# Patient Record
Sex: Female | Born: 1975 | Race: White | Hispanic: No | Marital: Single
Health system: Southern US, Academic
[De-identification: ages and names within clinical notes are randomized; demographics above are authoritative.]

## PROBLEM LIST (undated history)

## (undated) DIAGNOSIS — F419 Anxiety disorder, unspecified: Secondary | ICD-10-CM

## (undated) DIAGNOSIS — F411 Generalized anxiety disorder: Secondary | ICD-10-CM

## (undated) DIAGNOSIS — M62838 Other muscle spasm: Secondary | ICD-10-CM

## (undated) DIAGNOSIS — M129 Arthropathy, unspecified: Secondary | ICD-10-CM

## (undated) HISTORY — PX: HX GALL BLADDER SURGERY/CHOLE: SHX55

## (undated) HISTORY — DX: Generalized anxiety disorder: F41.1

## (undated) HISTORY — PX: NECK SURGERY: SHX720

## (undated) HISTORY — DX: Arthropathy, unspecified: M12.9

## (undated) HISTORY — PX: HX HYSTERECTOMY: SHX81

## (undated) HISTORY — DX: Other muscle spasm: M62.838

## (undated) HISTORY — PX: ABDOMINAL HYSTERECTOMY: SHX81

## (undated) HISTORY — PX: CHOLECYSTECTOMY: SHX55

---

## 2013-12-26 ENCOUNTER — Emergency Department (HOSPITAL_COMMUNITY)
Admission: EM | Admit: 2013-12-26 | Discharge: 2013-12-26 | Disposition: A | Discharge status: Home / Self Care | Payer: Medicaid Other | Attending: Emergency Medicine | Admitting: Emergency Medicine

## 2013-12-26 ENCOUNTER — Emergency Department (HOSPITAL_COMMUNITY): Payer: Medicaid Other

## 2013-12-26 DIAGNOSIS — X028XXA Other exposure to controlled fire in building or structure, initial encounter: Secondary | ICD-10-CM | POA: Insufficient documentation

## 2013-12-26 DIAGNOSIS — Y9389 Activity, other specified: Secondary | ICD-10-CM | POA: Insufficient documentation

## 2013-12-26 DIAGNOSIS — Y929 Unspecified place or not applicable: Secondary | ICD-10-CM | POA: Insufficient documentation

## 2013-12-26 DIAGNOSIS — T23219A Burn of second degree of unspecified thumb (nail), initial encounter: Secondary | ICD-10-CM

## 2013-12-26 DIAGNOSIS — T2111XA Burn of first degree of chest wall, initial encounter: Secondary | ICD-10-CM | POA: Insufficient documentation

## 2013-12-26 DIAGNOSIS — T24109A Burn of first degree of unspecified site of unspecified lower limb, except ankle and foot, initial encounter: Secondary | ICD-10-CM | POA: Insufficient documentation

## 2013-12-26 DIAGNOSIS — Z3202 Encounter for pregnancy test, result negative: Secondary | ICD-10-CM | POA: Insufficient documentation

## 2013-12-26 DIAGNOSIS — T2010XA Burn of first degree of head, face, and neck, unspecified site, initial encounter: Secondary | ICD-10-CM | POA: Insufficient documentation

## 2013-12-26 DIAGNOSIS — T2210XA Burn of first degree of shoulder and upper limb, except wrist and hand, unspecified site, initial encounter: Secondary | ICD-10-CM | POA: Insufficient documentation

## 2013-12-26 DIAGNOSIS — T300 Burn of unspecified body region, unspecified degree: Secondary | ICD-10-CM

## 2013-12-26 LAB — BASIC METABOLIC PANEL
BUN: 8 mg/dL (ref 6–23)
CALCIUM: 9.1 mg/dL (ref 8.4–10.5)
CO2: 21 mEq/L (ref 19–32)
Chloride: 103 mEq/L (ref 96–112)
Creatinine, Ser: 0.66 mg/dL (ref 0.50–1.10)
GFR calc non Af Amer: >90 mL/min (ref >90)
Glucose, Bld: 114 mg/dL — ABNORMAL HIGH (ref 70–99)
POTASSIUM: 4.2 meq/L (ref 3.7–5.3)
Sodium: 140 mEq/L (ref 137–147)

## 2013-12-26 LAB — PREPARE FRESH FROZEN PLASMA
UNIT DIVISION: 0
Unit division: 0

## 2013-12-26 LAB — URINALYSIS, ROUTINE W REFLEX MICROSCOPIC
BILIRUBIN URINE: NEGATIVE
GLUCOSE, UA: NEGATIVE mg/dL
Hgb urine dipstick: NEGATIVE
Ketones, ur: NEGATIVE mg/dL
Leukocytes, UA: NEGATIVE
Nitrite: NEGATIVE
PROTEIN: NEGATIVE mg/dL
Specific Gravity, Urine: 1.009 (ref 1.005–1.030)
Urobilinogen, UA: 0.2 mg/dL (ref 0.0–1.0)
pH: 7.5 (ref 5.0–8.0)

## 2013-12-26 LAB — PREGNANCY, URINE: Preg Test, Ur: NEGATIVE

## 2013-12-26 LAB — CBC
HEMATOCRIT: 40 % (ref 36.0–46.0)
Hemoglobin: 13.7 g/dL (ref 12.0–15.0)
MCH: 31.1 pg (ref 26.0–34.0)
MCHC: 34.3 g/dL (ref 30.0–36.0)
MCV: 90.7 fL (ref 78.0–100.0)
Platelets: 423 10*3/uL — ABNORMAL HIGH (ref 150–400)
RBC: 4.41 MIL/uL (ref 3.87–5.11)
RDW: 13.9 % (ref 11.5–15.5)
WBC: 14.8 10*3/uL — ABNORMAL HIGH (ref 4.0–10.5)

## 2013-12-26 MED ORDER — OXYCODONE-ACETAMINOPHEN 5-325 MG PO TABS: 1.0000 | ORAL_TABLET | ORAL | Status: DC | PRN

## 2013-12-26 MED ORDER — DOCUSATE SODIUM 100 MG PO CAPS: 100.0000 mg | ORAL_CAPSULE | Freq: Two times a day (BID) | ORAL | Status: DC

## 2013-12-26 MED ORDER — SILVER SULFADIAZINE 1 % EX CREA: 1.0000 "application " | TOPICAL_CREAM | Freq: Two times a day (BID) | CUTANEOUS | Status: DC

## 2013-12-26 MED ORDER — SODIUM CHLORIDE 0.9 % IV BOLUS (SEPSIS)
1000.0000 mL | Freq: Once | INTRAVENOUS | Status: AC
  Administered 2013-12-26: 1000 mL via INTRAVENOUS

## 2013-12-26 MED ORDER — SILVER SULFADIAZINE 1 % EX CREA
TOPICAL_CREAM | Freq: Once | CUTANEOUS | Status: AC
  Administered 2013-12-26: 16:00:00 via TOPICAL
  Filled 2013-12-26: qty 85

## 2013-12-26 MED ORDER — HYDROMORPHONE HCL PF 1 MG/ML IJ SOLN
1.0000 mg | Freq: Once | INTRAMUSCULAR | Status: AC
  Administered 2013-12-26: 1 mg via INTRAVENOUS
  Filled 2013-12-26: qty 1

## 2013-12-26 MED ORDER — HYDROMORPHONE HCL PF 1 MG/ML IJ SOLN
1.0000 mg | Freq: Once | INTRAMUSCULAR | Status: AC
  Administered 2013-12-26: 1 mg via INTRAVENOUS
  Filled 2013-12-26 (×2): qty 1
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
## 2013-12-26 NOTE — ED Notes (Signed)
Patient discharged to home with family. NAD.  
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
## 2013-12-26 NOTE — Discharge Instructions (Signed)
Burn Care Your skin is a natural barrier to infection. It is the largest organ of your body. Burns damage this natural protection. To help prevent infection, it is very important to follow your caregiver's instructions in the care of your burn. Burns are classified as:  First degree. There is only redness of the skin (erythema). No scarring is expected.  Second degree. There is blistering of the skin. Scarring may occur with deeper burns.  Third degree. All layers of the skin are injured, and scarring is expected. HOME CARE INSTRUCTIONS   Wash your hands well before changing your bandage.  Change your bandage as often as directed by your caregiver.  Remove the old bandage. If the bandage sticks, you may soak it off with cool, clean water.  Cleanse the burn thoroughly but gently with mild soap and water.  Pat the area dry with a clean, dry cloth.  Apply a thin layer of antibacterial cream to the burn.  Apply a clean bandage as instructed by your caregiver.  Keep the bandage as clean and dry as possible.  Elevate the affected area for the first 24 hours, then as instructed by your caregiver.  Only take over-the-counter or prescription medicines for pain, discomfort, or fever as directed by your caregiver. SEEK IMMEDIATE MEDICAL CARE IF:   You develop excessive pain.  You develop redness, tenderness, swelling, or red streaks near the burn.  The burned area develops yellowish-white fluid (pus) or a bad smell.  You have a fever. MAKE SURE YOU:   Understand these instructions.  Will watch your condition.  Will get help right away if you are not doing well or get worse. Document Released: 09/19/2005 Document Revised: 12/12/2011 Document Reviewed: 02/09/2011 [facility] Patient Information 2014 [place], [place].  First Degree and Second-Degree Burns First degree burns involve the outer layer of skin - similar to a sunburn.  A second-degree burn affects the 2 outer layers of  skin. The outer layer (epidermis) and the layer underneath it (dermis) are both burned. Another name for this type of burn is a partial thickness burn. A second-degree burn may be called minor or major. This depends on the size of the burn. It also depends on what parts of the skin are burned. Minor burns may be treated with first aid. Major burns are a medical emergency. A second-degree burn is worse than a first-degree burn, but not as bad as a third-degree burn. A first-degree burn affects only the epidermis. A third-degree burn goes through all the layers of skin. A second-degree burn usually heals in 3 to 4 weeks. A minor second-degree burn usually does not leave a scar.Deeper second-degree burns may lead to scarring of the skin or contractures over joints.Contractures are scars that form over joints and may lead to reduced mobility at those joints. CAUSES  Heat (thermal) injury. This happens when skin comes in contact with something very hot. It could be a flame, a hot object, hot liquid, or steam. Most second-degree burns are thermal injuries.  Radiation. Sunlight is one type of radiation that can burn the skin. Another type of radiation is used to heat food. Radiation is also used to treat some diseases, such as cancer. All types of radiation can burn the skin. Sunlight usually causes a first-degree burn. Radiation used for heating food or treating a disease can cause a second-degree burn.  Electricity. Electrical burns can cause more damage under the skin than on the surface. They should always be treated as major burns.  Chemicals. Many chemicals can burn the skin. The burn should be flushed with cool water and checked by an emergency caregiver. SYMPTOMS Symptoms of second-degree burns include:  Severe pain.  Extreme tenderness.  Deep redness.  Blistered skin.  Skin that has changed color.It might look blotchy, wet, or shiny.  Swelling. TREATMENT Some second-degree burns may  need to be treated in a hospital. These include major burns, electrical burns, and chemical burns. Many other second-degree burns can be treated with regular first aid, such as:  Cooling the burn. Use cool, germ-free (sterile) salt water. Place the burned area of skin into a tub of water, or cover the burned area with clean, wet towels.  Taking pain medicine.  Removing the dead skin from broken blisters. A trained caregiver may do this. Do not pop blisters.  Gently washing your skin with mild soap.  Covering the burned area with a cream.Silver sulfadiazine is a cream for burns. An antibiotic cream, such as bacitracin, may also be used to fight infection. Do not use other ointments or creams unless your caregiver says it is okay.  Protecting the burn with a sterile, non-sticky bandage.  Bandaging fingers and toes separately. This keeps them from sticking together.  Taking an antibiotic. This can help prevent infection.  Getting a tetanus shot. HOME CARE INSTRUCTIONS Medication  Take any medicine prescribed by your caregiver. Follow the directions carefully.  Ask your caregiver if you can take over-the-counter medicine to relieve pain and swelling. Do not give aspirin to children.  Make sure your caregiver knows about all other medicines you take.This includes over-the-counter medicines. Burn care  You will need to change the bandage on your burn. You may need to do this 2 or 3 times each day.  Gently clean the burned area.  Put ointment on it.  Cover the burn with a sterile bandage.  For some deeper burns or burns that cover a large area, compression garments may be prescribed. These garments can help minimize scarring and protect your mobility.  Do not put butter or oil on your skin. Use only the cream prescribed by your caregiver.  Do not put ice on your burn.  Do not break blisters on your skin.  Keep the bandaged area dry. You might need to take a sponge bath for  awhile.Ask your caregiver when you can take a shower or a tub bath again.  Do not scratch an itchy burn. Your caregiver may give you medicine to relieve very bad itching.  Infection is a big danger after a second-degree burn. Tell your caregiver right away if you have signs of infection, such as:  Redness or changing color in the burned area.  Fluid leaking from the burn.  Swelling in the burn area.  A bad smell coming from the wound. Follow-up  Keep all follow-up appointments.This is important. This is how your caregiver can tell if your treatment is working.  Protect your burn from sunlight.Use sunscreen whenever you go outside.Burned areas may be sensitive to the sun for up to 1 year. Exposure to the sun may also cause permanent darkening of scars. SEEK MEDICAL CARE IF:  You have any questions about medicines.  You have any questions about your treatment.  You wonder if it is okay to do a particular activity.  You develop a fever of more than 100.5 F (38.1 C). SEEK IMMEDIATE MEDICAL CARE IF:  You think your burn might be infected. It may change color, become red, leak fluid, swell,  or smell bad.  You develop a fever of more than 102 F (38.9 C). Document Released: 02/21/2011 Document Revised: 12/12/2011 Document Reviewed: 02/21/2011 [facility] Patient Information 2014 [place], [place].    Emergency Department Resource Guide 1) Find a Doctor and Pay Out of Pocket Although you won't have to find out who is covered by your insurance plan, it is a good idea to ask around and get recommendations. You will then need to call the office and see if the doctor you have chosen will accept you as a new patient and what types of options they offer for patients who are self-pay. Some doctors offer discounts or will set up payment plans for their patients who do not have insurance, but you will need to ask so you aren't surprised when you get to your appointment.  2) Contact Your  Local Health Department Not all health departments have doctors that can see patients for sick visits, but many do, so it is worth a call to see if yours does. If you don't know where your local health department is, you can check in your phone book. The CDC also has a tool to help you locate your state's health department, and many state websites also have listings of all of their local health departments.  3) Find a Walk-in Clinic If your illness is not likely to be very severe or complicated, you may want to try a walk in clinic. These are popping up all over the country in pharmacies, drugstores, and shopping centers. They're usually staffed by nurse practitioners or physician assistants that have been trained to treat common illnesses and complaints. They're usually fairly quick and inexpensive. However, if you have serious medical issues or chronic medical problems, these are probably not your best option.  No Primary Care Doctor: - Call Health Connect at  [phone] - they can help you locate a primary care doctor that  accepts your insurance, provides certain services, etc. - Physician Referral Service- [phone]  Chronic Pain Problems: Organization         Address  Phone   Notes  [facility]  [phone] Patients need to be referred by their primary care doctor.   Medication Assistance: Organization         Address  Phone   Notes  [facility] Medication [facility] [address] [place] [address] [place], [place] [address] [phone] --Must be a resident of [facility] -- Must have NO insurance coverage whatsoever (no Medicaid/ Medicare, etc.) -- The pt. MUST have a primary care doctor that directs their care regularly and follows them in the community   [facility]  [phone]   [employer]  [phone]    Agencies that provide inexpensive medical care: Organization         Address  Phone   Notes  [facility]  [phone]   [facility]    [phone]   [facility] [address] [place], [place] [address] [phone]   [facility] [address] [place] [place] [phone]   Planned Parenthood    [phone]   [facility]    [phone]   [facility]  [address], [place] Phone:  [phone], Fax:  [phone] Hours of Operation:  9 am - 6 pm, M-F.  Also accepts Medicaid/Medicare and self-pay.  [facility]  [address], [place] Phone: [phone], Fax: [phone]. Hours of Operation:  8:30 am - 5:30 pm, M-F.  Also accepts Medicaid and self-pay.  [facility] [place] [address], [place] Phone: [phone]) [phone]   [facility] [address], [place], [place] [phone], Ext. [phone] Mondays & Thursdays: 7-9 AM.  First 15 patients are seen on a first come, first serve basis.    Medicaid-accepting [facility] Providers:  Organization         Address  Phone   Notes  [facility] [address], [place] [phone]) [phone] Also accepts self-pay patients.  [facility] [address], [place]  [phone]   [facility] [address], [place] [phone]   [facility] [address] [place] [address], [place] [phone]   Renaye RakersVeita Bland [address], [place]   [phone] Only accepts [place] Access [place] patients after they have their name applied to their card.   Self-Pay (no insurance) in [facility]:  Organization         Address  Phone   Notes  Sickle Cell Patients, [facility] [address], [place] [phone]   [facility] [address], [place] [phone]   [facility] [place]  [address]  [address], [place] [phone]) [phone]   [facility]/Dr. Osei-Bonsu  [address] [place] [address], [place] or [phone], [place] [phone] Phone number for both [place] and [place] locations is the same.  [facility] [address], [place] [phone]   [facility] [place] [address] [place] [address], [place] or [address] [phone]) [phone]) [phone]   [facility] [address], [place] [phone]) [phone], phone; [phone], fax Sees patients 1st and 3rd Saturday of every month.  Must not qualify for public or private insurance (i.e. Medicaid, Medicare, [place] Health Choice, Veterans' Benefits)  Household income should be no more than 200% of the poverty level The clinic cannot treat you if you are pregnant or think you are pregnant  Sexually transmitted diseases are not treated at the clinic.    Dental Care: Organization         Address  Phone  Notes  [facility] [address], [place] [phone] Accepts children up to age 36 who are enrolled in [place] or [place] Health Choice; pregnant women with a Medicaid card; and children who have applied for Medicaid or [place] Health Choice, but were declined, whose parents can pay a reduced fee at time of service.  [facility] [place]  [address], [place] [phone] Accepts children up to age 36 who are enrolled in [place] or [place] Health Choice; pregnant women with a Medicaid card; and children who have applied for Medicaid or [place] Health Choice, but were declined, whose parents can pay a reduced fee at time of service.  [facility]  [address], [place] [phone] Patients are seen by appointment only. Walk-ins are not accepted. [facility] will see patients 36 years of age and older. Monday - Tuesday (8am-5pm) Most  Wednesdays (8:30-5pm) $30 per visit, cash only  [place] [facility]  [address], [facility] [phone] Patients are seen by appointment only. Walk-ins are not accepted. [facility] will see patients 78 years of age and older. One Wednesday Evening (Monthly: Volunteer Based).  $30 per visit, cash only  [employer]  [phone] for adults; Children under age 15, call Graduate Pediatric Dentistry at [phone]. Children aged 87-14, please call [phone] to request a pediatric application.  Dental services are provided in all areas of dental care including fillings, crowns and bridges, complete and partial dentures, implants, gum treatment, root canals, and extractions. Preventive care is also provided. Treatment is provided to both adults and children. Patients are selected via a lottery and there is often a waiting list.   [facility] [address] [place]  [phone] [email or web address]   [facility] [address] [place], [place] [phone], Ext. [phone] Second and Fourth Thursday of each month, opens at 6:30 AM; Clinic ends at 9 AM.  Patients are seen on a first-come first-served basis, and a limited number are seen during each clinic.   [facility]  [address] [place] [place], [place] [phone]   Eligibility Requirements You must have lived in [place], [place], or [place] counties for at least the last three months.   You cannot be eligible for state or federal sponsored [place], including [employer], [place], or [employer].   You generally cannot be eligible for healthcare insurance through your employer.    How to apply: Eligibility screenings are held every Tuesday and Wednesday afternoon from 1:00 pm until 4:00 pm. You do not need an appointment for the interview!  [facility] [address] [place], [place] [phone]     [facility] Health Department  [phone]   [facility] Health Department  [phone]   [facility] Health Department  [phone]    Behavioral Health Resources in the Community: Intensive Outpatient Programs Organization         Address  Phone  Notes  [facility] Services [address], [place], [place] [phone]   [facility] [address] [place], [place] [phone]   [facility] [address] [place], [place]  [phone]   [facility] Mental Health [address],  [place], [place] [phone] or [phone]   Substance Abuse Resources Organization         Address  Phone  Notes  [facility] Drug Services  [phone]   [facility]  [phone]   [facility] [place]  [phone]   Floydene Flock  [phone]   Residential & Outpatient Substance Abuse Program  [phone]   Psychological Services Organization         Address  Phone  Notes  [facility]al Health  [phone]   [facility] Services  [phone]   [facility] [address], [place] [phone] or [phone]    Mobile Crisis Teams Organization         Address  Phone  Notes  [facility], Mobile Crisis Care Unit  [phone]   [facility]  [address] [place], [place] [phone]   Doristine Locks [address] [place] [phone]    Self-Help/Support Groups Organization         Address  Phone             Notes  [facility]  [facility] [place] - variety of support groups  [phone]- [phone] Call for more information  Narcotics Anonymous (NA), [facility] [address], [employer] [place]  2 meetings at this location   Statistician         Address  Phone  Notes  [facility] [address],    [place]  [phone]   [facility]  [address] [place]  [address], [place], [place] [phone]   [facility] [address] [place], [place] [phone] Admissions: 8am-3pm M-F  [facility] [address]    [place], [place] [phone]   [facility] [address] [place], [place], [place] [phone]   [facility] [address]  [place], [place] [phone]   [facility] - Intensive Outpatient [address], [place], [place] [phone]   [facility] ([facility] [address] [place]  [place], [place] [phone] or [phone]   [facility] ([facility]) [address], [place], [place] [phone] Accepts Medicaid  [facility] [address],  [place] [phone] Substance Abuse/Addiction Treatment   [facility] Organization         Address  Phone  Notes  [facility]  [phone]   Angie Fava, PhD [address] [place], [place]   [phone] or [phone]   [facility]   [address] [place], [place] [phone]   [facility] [address], [place], [place] [phone] Insurance/Medicaid/sponsorship through [facility] [address]                                    [place], [place] [phone] Therapy/tele-psych/case  [facility] [address], [place] [phone]    Dr. Lolly Mustache  [phone]   [facility]  [employer] [facility] Dept. 1) [address] [place] 2) [address] [place] 3)  [address], [place] [phone]  [phone]   [facility] Child Abuse Hotline [phone] or [phone] (After Hours)

## 2014-07-14 ENCOUNTER — Emergency Department (HOSPITAL_COMMUNITY)
Admission: EM | Admit: 2014-07-14 | Discharge: 2014-07-14 | Disposition: A | Discharge status: Home / Self Care | Payer: Medicaid Other | Attending: Emergency Medicine | Admitting: Emergency Medicine

## 2014-07-14 ENCOUNTER — Encounter (HOSPITAL_COMMUNITY): Payer: Self-pay | Admitting: Emergency Medicine

## 2014-07-14 ENCOUNTER — Emergency Department (HOSPITAL_COMMUNITY): Payer: Medicaid Other

## 2014-07-14 DIAGNOSIS — F419 Anxiety disorder, unspecified: Secondary | ICD-10-CM | POA: Diagnosis not present

## 2014-07-14 DIAGNOSIS — M25512 Pain in left shoulder: Secondary | ICD-10-CM | POA: Insufficient documentation

## 2014-07-14 DIAGNOSIS — Z720 Tobacco use: Secondary | ICD-10-CM | POA: Diagnosis not present

## 2014-07-14 HISTORY — DX: Anxiety disorder, unspecified: F41.9

## 2014-07-14 MED ORDER — TRAMADOL HCL 50 MG PO TABS: 50.0000 mg | ORAL_TABLET | Freq: Four times a day (QID) | ORAL | Status: DC | PRN

## 2014-07-14 NOTE — ED Notes (Signed)
Pt c/o left shoulder pain x 3 weeks worse with movement; pt unsure of initial injury

## 2014-07-14 NOTE — ED Provider Notes (Signed)
CSN: [id number]     Arrival date & time 07/14/14  1609 History  This chart was scribed for non-physician practitioner, Terri Piedraourtney Forcucci, PA-C working with Rolland PorterMark James, MD by Greggory StallionKayla Andersen, ED scribe. This patient was seen in room TR05C/TR05C and the patient's care was started at 5:40 PM.    Chief Complaint  Patient presents with  . Shoulder Pain   The history is provided by the patient. No language interpreter was used.   HPI Comments: Diane Reyes is a 38 y.o. female who presents to the Emergency Department complaining of worsening left shoulder pain that started 3 weeks ago. Reports associated numbness and tingling in her left arm. 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 Describes pain as sharp and stabbing but states movements worsen it and cause it to become aching. She has done stretching and taken tylenol and BC powders with no relief. States her GI [facility] but has not seen him in over one year. Pt does not have a PCP. Smokes cigarettes daily. Pt is supposed to take daily medications for her anxiety but doesn't. She states she is currently trying to set up an appointment with a psychiatrist.   [place]   Past Medical History  Diagnosis Date  . Anxiety    History reviewed. No pertinent past surgical history. History reviewed. No pertinent family history. History  Substance Use Topics  . Smoking status: Current Every Day Smoker  . Smokeless tobacco: Not on file  . Alcohol Use: Yes     Comment: occ   OB History   Grav Para Term Preterm Abortions TAB SAB Ect Mult Living                 Review of Systems  Musculoskeletal: Positive for arthralgias.  Neurological: Positive for numbness.  All other systems reviewed and are negative.  Allergies  Review of patient's allergies indicates no known allergies.  Home Medications   Prior to Admission medications   Medication Sig Start Date End Date Taking?  Authorizing Provider  traMADol (ULTRAM) 50 MG tablet Take 1 tablet (50 mg total) by mouth every 6 (six) hours as needed. 07/14/14   Hartlee Amedee A Forcucci, PA-C   BP 108/69  Pulse 77  Temp(Src) 98.7 F (37.1 C)  Resp 18  Wt 118 lb (53.524 kg)  SpO2 97%  LMP 07/07/2014  Physical Exam  Nursing note and vitals reviewed. Constitutional: She is oriented to person, place, and time. She appears well-developed and well-nourished. No distress.  HENT:  Head: Normocephalic and atraumatic.  Mouth/Throat: Oropharynx is clear and moist. No oropharyngeal exudate.  Eyes: Conjunctivae and EOM are normal.  Neck: Normal range of motion. Neck supple. No tracheal deviation present.  Cardiovascular: Normal rate, regular rhythm and normal heart sounds.  Exam reveals no gallop and no friction rub.   No murmur heard. Pulmonary/Chest: Effort normal and breath sounds normal. No respiratory distress. She has no wheezes. She has no rhonchi. She has no rales.  Musculoskeletal: Normal range of motion.       Left shoulder: She exhibits bony tenderness and pain. She exhibits normal range of motion, no tenderness, no swelling, no effusion, no crepitus, no deformity, no laceration, no spasm, normal pulse and normal strength.  Tenderness right over left AC joint.   Lymphadenopathy:    She has no cervical adenopathy.  Neurological: She is alert and oriented to person, place, and time.  Skin: Skin is warm and dry.  Psychiatric: She has a normal mood and affect. Her behavior is normal. Judgment and thought content normal.    ED Course  Procedures (including critical care time)  DIAGNOSTIC STUDIES: Oxygen Saturation is 97% on RA, normal by my interpretation.    COORDINATION OF CARE: 5:49 PM-Discussed treatment plan which includes shoulder xray with pt at bedside and pt agreed to plan.   Labs Review Labs Reviewed - No data to display  Imaging Review Dg Shoulder Left  07/14/2014   CLINICAL DATA:  Left shoulder  pain beginning approximately 3 weeks ago. No known injury.  EXAM: LEFT SHOULDER - 2+ VIEW  COMPARISON:  None.  FINDINGS: Imaged bones, joints and soft tissues appear normal.  IMPRESSION: Negative exam.   Electronically Signed   By: Drusilla Kannerhomas  Dalessio M.D.   On: 07/14/2014 18:56     EKG Interpretation None      MDM   Final diagnoses:  Left shoulder pain   Patient  Is a 38 y.o. Female who presents to the ED with left shoulder pain. Physical exam reveals neurovascularly intact left shoulder with tenderness over the left AC joint.  Rotator cuff is intact.  Plain film xrays negative for arthritis, dislocation, and fracture.  Patient does have history of stomach issues and is not a candidate for NSAIDs.  Will discharge the patient home with tramadol.  [facility]  Patient to return for septic joint symptoms.  Patient states understanding and agreement.  Patient stable for discharge.     I personally performed the services described in this documentation, which was scribed in my presence. The recorded information has been reviewed and is accurate.  Eben Burowourtney A Forcucci, PA-C 07/14/14 1914

## 2014-07-14 NOTE — ED Notes (Signed)
Declined W/C at D/C and was escorted to lobby by RN. 

## 2014-07-14 NOTE — Discharge Instructions (Signed)

## 2014-07-20 NOTE — ED Provider Notes (Signed)
Medical screening examination/treatment/procedure(s) were performed by non-physician practitioner and as supervising physician I was immediately available for consultation/collaboration.   EKG Interpretation None        Autry Prust, MD 07/20/14 0859 

## 2014-07-31 ENCOUNTER — Encounter (HOSPITAL_COMMUNITY): Payer: Self-pay | Admitting: Emergency Medicine

## 2014-07-31 ENCOUNTER — Emergency Department (HOSPITAL_COMMUNITY)
Admission: EM | Admit: 2014-07-31 | Discharge: 2014-07-31 | Disposition: A | Discharge status: Home / Self Care | Payer: Medicaid Other | Attending: Emergency Medicine | Admitting: Emergency Medicine

## 2014-07-31 ENCOUNTER — Emergency Department (HOSPITAL_COMMUNITY): Payer: Medicaid Other

## 2014-07-31 DIAGNOSIS — R079 Chest pain, unspecified: Secondary | ICD-10-CM | POA: Diagnosis present

## 2014-07-31 DIAGNOSIS — R110 Nausea: Secondary | ICD-10-CM | POA: Diagnosis not present

## 2014-07-31 DIAGNOSIS — R062 Wheezing: Secondary | ICD-10-CM | POA: Diagnosis not present

## 2014-07-31 DIAGNOSIS — Z79899 Other long term (current) drug therapy: Secondary | ICD-10-CM | POA: Insufficient documentation

## 2014-07-31 DIAGNOSIS — Z720 Tobacco use: Secondary | ICD-10-CM | POA: Diagnosis not present

## 2014-07-31 DIAGNOSIS — M79602 Pain in left arm: Secondary | ICD-10-CM | POA: Insufficient documentation

## 2014-07-31 DIAGNOSIS — R0602 Shortness of breath: Secondary | ICD-10-CM | POA: Diagnosis not present

## 2014-07-31 DIAGNOSIS — F419 Anxiety disorder, unspecified: Secondary | ICD-10-CM | POA: Diagnosis not present

## 2014-07-31 LAB — CBC
HEMATOCRIT: 37.2 % (ref 36.0–46.0)
Hemoglobin: 12.5 g/dL (ref 12.0–15.0)
MCH: 29.6 pg (ref 26.0–34.0)
MCHC: 33.6 g/dL (ref 30.0–36.0)
MCV: 87.9 fL (ref 78.0–100.0)
PLATELETS: 487 10*3/uL — AB (ref 150–400)
RBC: 4.23 MIL/uL (ref 3.87–5.11)
RDW: 14.6 % (ref 11.5–15.5)
WBC: 12 10*3/uL — AB (ref 4.0–10.5)

## 2014-07-31 LAB — BASIC METABOLIC PANEL
Anion gap: 13 (ref 5–15)
BUN: 5 mg/dL — AB (ref 6–23)
CO2: 25 mEq/L (ref 19–32)
CREATININE: 0.72 mg/dL (ref 0.50–1.10)
Calcium: 9.1 mg/dL (ref 8.4–10.5)
Chloride: 101 mEq/L (ref 96–112)
GFR calc Af Amer: >90 mL/min (ref >90)
Glucose, Bld: 113 mg/dL — ABNORMAL HIGH (ref 70–99)
Potassium: 3.6 mEq/L — ABNORMAL LOW (ref 3.7–5.3)
Sodium: 139 mEq/L (ref 137–147)

## 2014-07-31 LAB — I-STAT TROPONIN, ED: Troponin i, poc: 0.02 ng/mL (ref 0.00–0.08)

## 2014-07-31 MED ORDER — PANTOPRAZOLE SODIUM 40 MG IV SOLR
40.0000 mg | Freq: Once | INTRAVENOUS | Status: AC
  Administered 2014-07-31: 40 mg via INTRAVENOUS
  Filled 2014-07-31: qty 40

## 2014-07-31 MED ORDER — ALBUTEROL SULFATE HFA 108 (90 BASE) MCG/ACT IN AERS
2.0000 | INHALATION_SPRAY | Freq: Once | RESPIRATORY_TRACT | Status: DC
  Filled 2014-07-31: qty 6.7

## 2014-07-31 MED ORDER — FAMOTIDINE IN NACL 20-0.9 MG/50ML-% IV SOLN
20.0000 mg | Freq: Once | INTRAVENOUS | Status: AC
  Administered 2014-07-31: 20 mg via INTRAVENOUS
  Filled 2014-07-31: qty 50

## 2014-07-31 MED ORDER — TRAMADOL HCL 50 MG PO TABS: 50.0000 mg | ORAL_TABLET | Freq: Four times a day (QID) | ORAL | Status: DC | PRN

## 2014-07-31 MED ORDER — IPRATROPIUM-ALBUTEROL 0.5-2.5 (3) MG/3ML IN SOLN
3.0000 mL | RESPIRATORY_TRACT | Status: DC
  Administered 2014-07-31: 3 mL via RESPIRATORY_TRACT
  Filled 2014-07-31: qty 3

## 2014-07-31 MED ORDER — PROMETHAZINE HCL 25 MG/ML IJ SOLN
25.0000 mg | Freq: Once | INTRAMUSCULAR | Status: AC
  Administered 2014-07-31: 25 mg via INTRAVENOUS
  Filled 2014-07-31: qty 1

## 2014-07-31 NOTE — ED Provider Notes (Signed)
CSN: [id number]     Arrival date & time 07/31/14  0920 History   First MD Initiated Contact with Patient 07/31/14 [phone]     Chief Complaint  Patient presents with  . Anxiety  . Abdominal Pain  . Gastrophageal Reflux  . Arm Pain     (Consider location/radiation/quality/duration/timing/severity/associated sxs/prior Treatment) HPI Pt is a 38yo female with hx of asthma and acid reflux presenting to ED today c/o chest pain, abdominal pain, and nausea w/o vomiting,  Symptoms started this morning. Pt states she is also having left sided neck and arm pain.  Pain is constant, sharp and tight sensation in her chest. Left sided neck and shoulder pain is 8/10, chest pain is 2/10.  States it does not feel like her normal acid reflux but states she has not been taking her prilosec like she is suppose to.  Pt states she is concerned with her chest pain due to smoking meth yesterday and drinking 3 beers but denies cocaine or any other drug use.  Denies personal hx of CAD. Does report an uncle died for an MI in his 5240s.  Pt states she has been treated for asthma in the past but no official diagnosis, states it only flares up when she is sick. Does report being a daily smoker, stating "I smoke too much." pt is planning on quitting.  Pt also reports being under a lot of stress at home. Denies SI/HI. No other significant PMH.    Past Medical History  Diagnosis Date  . Anxiety    History reviewed. No pertinent past surgical history. History reviewed. No pertinent family history. History  Substance Use Topics  . Smoking status: Current Every Day Smoker  . Smokeless tobacco: Not on file  . Alcohol Use: Yes     Comment: occ   OB History   Grav Para Term Preterm Abortions TAB SAB Ect Mult Living                 Review of Systems  Constitutional: Negative for fever and chills.  Respiratory: Positive for cough, chest tightness and shortness of breath.   Cardiovascular: Positive for chest pain. Negative for  palpitations and leg swelling.  Gastrointestinal: Positive for nausea. Negative for vomiting, abdominal pain, diarrhea and constipation.      Allergies  Review of patient's allergies indicates no known allergies.  Home Medications   Prior to Admission medications   Medication Sig Start Date End Date Taking? Authorizing Provider  acetaminophen (TYLENOL) 500 MG tablet Take 1,500-2,000 mg by mouth every 6 (six) hours as needed for mild pain or moderate pain.   Yes Historical Provider, MD  albuterol (PROVENTIL HFA;VENTOLIN HFA) 108 (90 BASE) MCG/ACT inhaler Inhale 1-2 puffs into the lungs every 6 (six) hours as needed for wheezing or shortness of breath.   Yes Historical Provider, MD  traMADol (ULTRAM) 50 MG tablet Take 1 tablet (50 mg total) by mouth every 6 (six) hours as needed. 07/14/14  Yes Courtney A Forcucci, PA-C  traMADol (ULTRAM) 50 MG tablet Take 1 tablet (50 mg total) by mouth every 6 (six) hours as needed. 07/31/14   Junius FinnerErin O'Malley, PA-C   BP 144/84  Pulse 92  Temp(Src) 98.3 F (36.8 C) (Oral)  Resp 20  SpO2 100%  LMP 07/07/2014 Physical Exam  Nursing note and vitals reviewed. Constitutional: She appears well-developed and well-nourished. No distress.  HENT:  Head: Normocephalic and atraumatic.  Eyes: Conjunctivae are normal. No scleral icterus.  Neck: Normal range of  motion.  Cardiovascular: Normal rate, regular rhythm and normal heart sounds.   Pulmonary/Chest: Effort normal. No respiratory distress. She has wheezes. She has no rales. She exhibits no tenderness.  No respiratory distress.  Faint inspiratory and expiratory wheeze in lower lung fields bilaterally.    Abdominal: Soft. Bowel sounds are normal. She exhibits no distension and no mass. There is no tenderness. There is no rebound and no guarding.  Soft, non-distended, non-tender. No CVAT  Musculoskeletal: Normal range of motion.  Neurological: She is alert.  Skin: Skin is warm and dry. She is not diaphoretic.     ED Course  Procedures (including critical care time) Labs Review Labs Reviewed  CBC - Abnormal; Notable for the following:    WBC 12.0 (*)    Platelets 487 (*)    All other components within normal limits  BASIC METABOLIC PANEL - Abnormal; Notable for the following:    Potassium 3.6 (*)    Glucose, Bld 113 (*)    BUN 5 (*)    All other components within normal limits  I-STAT TROPOININ, ED  POC URINE PREG, ED    Imaging Review Dg Chest 2 View  07/31/2014   CLINICAL DATA:  Reflux.  Nausea.  Neck and left arm pain.  EXAM: CHEST  2 VIEW  COMPARISON:  12/26/2013.  FINDINGS: The heart size and mediastinal contours are within normal limits. Both lungs are clear. Surgical clips right upper quadrant. The visualized skeletal structures are unremarkable.  IMPRESSION: No active cardiopulmonary disease.   Electronically Signed   By: Maisie Fushomas  Register   On: 07/31/2014 11:41     EKG Interpretation   Date/Time:  Thursday July 31 2014 09:26:38 EDT Ventricular Rate:  78 PR Interval:  89 QRS Duration: 93 QT Interval:  384 QTC Calculation: 437 R Axis:   79 Text Interpretation:  Sinus rhythm Short PR interval Baseline wander No  old tracing to compare Confirmed by [facility]  MD, Nicholos JohnsKATHLEEN [phone]) on  07/31/2014 11:39:19 AM      MDM   Final diagnoses:  Chest pain  Left sided chest pain  Left arm pain  Wheeze   Pt low risk for ACS, PERC negative. Per medical records pt seen previously this month with c/o left shoulder pain.  Workup today including labs, CXR, EKG- unremarkable. Advised pt to establish care and f/u with orthopedist for further evaluation and treatment of continued left shoulder pain. Home care instructions provided including encouraged pt to take her Prilosec as prescribed. Return precautions provided. Pt verbalized understanding and agreement with tx plan.     Junius FinnerErin O'Malley, PA-C 08/01/14 0730

## 2014-07-31 NOTE — Progress Notes (Signed)
pcp is [facility] [address] [place] [phone]

## 2014-07-31 NOTE — ED Notes (Signed)
Pt reports she has been under a lot of stress lately. Started getting a headache last night. This morning started feeling nauseas and acid reflux, has not vomited. Pt reports for five min had left sided neck pain and left arm pain. At present neck pain 7/10.

## 2014-07-31 NOTE — ED Notes (Signed)
Patient left prior to discharge vitals and discharge teaching, prior to IV being removed, prior to final medications, prior to receiving prescriptions or discharge papers. Pt did not inform staff of intention to leave. Pt seen leaving by staff. Pt had IV catheter in place, appears to have removed IV catheter herself as used angiocatheter was found in room on floor.

## 2016-05-11 ENCOUNTER — Emergency Department (HOSPITAL_COMMUNITY): Payer: Medicaid Other

## 2016-05-11 ENCOUNTER — Encounter (HOSPITAL_COMMUNITY): Payer: Self-pay | Admitting: *Deleted

## 2016-05-11 ENCOUNTER — Emergency Department (HOSPITAL_COMMUNITY)
Admission: EM | Admit: 2016-05-11 | Discharge: 2016-05-11 | Disposition: A | Discharge status: Home / Self Care | Payer: Medicaid Other | Attending: Emergency Medicine | Admitting: Emergency Medicine

## 2016-05-11 DIAGNOSIS — N390 Urinary tract infection, site not specified: Secondary | ICD-10-CM | POA: Insufficient documentation

## 2016-05-11 DIAGNOSIS — R1030 Lower abdominal pain, unspecified: Secondary | ICD-10-CM | POA: Diagnosis present

## 2016-05-11 DIAGNOSIS — F17210 Nicotine dependence, cigarettes, uncomplicated: Secondary | ICD-10-CM | POA: Insufficient documentation

## 2016-05-11 DIAGNOSIS — Z79899 Other long term (current) drug therapy: Secondary | ICD-10-CM | POA: Diagnosis not present

## 2016-05-11 LAB — URINALYSIS, ROUTINE W REFLEX MICROSCOPIC
BILIRUBIN URINE: NEGATIVE
Glucose, UA: NEGATIVE mg/dL
HGB URINE DIPSTICK: NEGATIVE
Ketones, ur: 15 mg/dL — AB
Leukocytes, UA: NEGATIVE
NITRITE: POSITIVE — AB
PROTEIN: NEGATIVE mg/dL
Specific Gravity, Urine: 1.022 (ref 1.005–1.030)
pH: 7.5 (ref 5.0–8.0)

## 2016-05-11 LAB — URINE MICROSCOPIC-ADD ON: RBC / HPF: NONE SEEN RBC/hpf (ref 0–5)

## 2016-05-11 MED ORDER — KETOROLAC TROMETHAMINE 60 MG/2ML IM SOLN
60.0000 mg | Freq: Once | INTRAMUSCULAR | Status: AC
  Administered 2016-05-11: 60 mg via INTRAMUSCULAR
  Filled 2016-05-11: qty 2

## 2016-05-11 MED ORDER — PHENAZOPYRIDINE HCL 200 MG PO TABS: 200.0000 mg | ORAL_TABLET | Freq: Three times a day (TID) | ORAL | 0 refills | Status: AC | PRN

## 2016-05-11 MED ORDER — CIPROFLOXACIN HCL 250 MG PO TABS: 250.0000 mg | ORAL_TABLET | Freq: Two times a day (BID) | ORAL | 0 refills | Status: AC

## 2016-05-11 NOTE — ED Triage Notes (Signed)
Pt reports bila flank pain with dysuria.  Pt reports her bladder feels like it's going to fall when she urinates.  Denies hematuria at this time.  Pt states she has n/v d/t bleeding ulcers.

## 2016-05-11 NOTE — Discharge Instructions (Signed)
Cipro and Pyridium as prescribed.  Take ibuprofen 600 mL every 6 hours as needed for pain.  Return to the emergency department if you develop high fever, worsening pain, or other new and concerning symptoms.

## 2016-05-11 NOTE — ED Notes (Signed)
Pt in a hurry to leave and left prior to signing for discharge instructions.  No reaction to medication noted at time of discharge.

## 2016-05-11 NOTE — ED Provider Notes (Signed)
[facility]-EMERGENCY DEPT Provider Note   CSN: [id number] Arrival date & time: 05/11/16  1611  First Provider Contact:  None       History   Chief Complaint Chief Complaint  Patient presents with  . Flank Pain    HPI Navah Grondin is a 40 y.o. female.  Patient is a 40 year old female with past medical history of hysterectomy secondary to bleeding fibroids. She presents for evaluation of suprapubic discomfort radiating to both flanks and burning upon urination that has been worsening over the past several days. She denies any fevers or chills. She denies any blood in her urine.   The history is provided by the patient.  Flank Pain  This is a new problem. The current episode started more than 2 days ago. The problem has been gradually worsening. Associated symptoms include abdominal pain. Exacerbated by: Urinating. Nothing relieves the symptoms. She has tried nothing for the symptoms. The treatment provided no relief.    Past Medical History:  Diagnosis Date  . Anxiety     There are no active problems to display for this patient.   Past Surgical History:  Procedure Laterality Date  . ABDOMINAL HYSTERECTOMY      OB History    No data available       Home Medications    Prior to Admission medications   Medication Sig Start Date End Date Taking? Authorizing Provider  acetaminophen (TYLENOL) 500 MG tablet Take 1,500-2,000 mg by mouth every 6 (six) hours as needed for mild pain or moderate pain.   Yes Historical Provider, MD  albuterol (PROVENTIL HFA;VENTOLIN HFA) 108 (90 BASE) MCG/ACT inhaler Inhale 1-2 puffs into the lungs every 6 (six) hours as needed for wheezing or shortness of breath.   Yes Historical Provider, MD    Family History No family history on file.  Social History Social History  Substance Use Topics  . Smoking status: Current Every Day Smoker    Types: Cigarettes  . Smokeless tobacco: Never Used  . Alcohol use Yes     Comment: 3 times a week      Allergies   Review of patient's allergies indicates no known allergies.   Review of Systems Review of Systems  Gastrointestinal: Positive for abdominal pain.  Genitourinary: Positive for flank pain.  All other systems reviewed and are negative.    Physical Exam Updated Vital Signs BP 149/83 (BP Location: Right Arm)   Pulse 98   Temp 98.3 F (36.8 C) (Oral)   Resp 18   Ht  (1.6 m)   Wt 130 lb (59 kg)   LMP 07/07/2014   SpO2 100%   BMI 23.03 kg/m   Physical Exam  Constitutional: She is oriented to person, place, and time. She appears well-developed and well-nourished. No distress.  HENT:  Head: Normocephalic and atraumatic.  Neck: Normal range of motion. Neck supple.  Cardiovascular: Normal rate and regular rhythm.  Exam reveals no gallop and no friction rub.   No murmur heard. Pulmonary/Chest: Effort normal and breath sounds normal. No respiratory distress. She has no wheezes.  Abdominal: Soft. Bowel sounds are normal. She exhibits no distension. There is tenderness. There is no rebound and no guarding.  There is suprapubic tenderness to palpation.  Musculoskeletal: Normal range of motion.  Neurological: She is alert and oriented to person, place, and time.  Skin: Skin is warm and dry. She is not diaphoretic.  Nursing note and vitals reviewed.    ED Treatments / Results  Labs (  all labs ordered are listed, but only abnormal results are displayed) Labs Reviewed  URINALYSIS, ROUTINE W REFLEX MICROSCOPIC (NOT AT [facility])    EKG  EKG Interpretation None       Radiology No results found.  Procedures Procedures (including critical care time)  Medications Ordered in ED Medications - No data to display   Initial Impression / Assessment and Plan / ED Course  I have reviewed the triage vital signs and the nursing notes.  Pertinent labs & imaging results that were available during my care of the patient were reviewed by me and considered in my  medical decision making (see chart for details).  Clinical Course      Final Clinical Impressions(s) / ED Diagnoses   Final diagnoses:  None   Patient presents with complaints of suprapubic pain radiating to the bilateral flanks. It is worse when she urinates. CT scan does not show a kidney stone. The urinalysis shows positive nitrites but is otherwise unimpressive. She will be treated with Cipro, Pyridium, and when necessary return.  New Prescriptions New Prescriptions   No medications on file     Geoffery Lyonsouglas Warden Buffa, MD 05/11/16 1850

## 2016-08-05 ENCOUNTER — Other Ambulatory Visit: Payer: Self-pay | Admitting: Internal Medicine

## 2016-08-05 DIAGNOSIS — N6322 Unspecified lump in the left breast, upper inner quadrant: Secondary | ICD-10-CM

## 2016-10-03 ENCOUNTER — Encounter (HOSPITAL_COMMUNITY): Payer: Self-pay | Admitting: Emergency Medicine

## 2016-10-03 ENCOUNTER — Emergency Department (HOSPITAL_COMMUNITY)
Admission: EM | Admit: 2016-10-03 | Discharge: 2016-10-03 | Disposition: A | Discharge status: Home / Self Care | Payer: Medicaid Other | Attending: Emergency Medicine | Admitting: Emergency Medicine

## 2016-10-03 ENCOUNTER — Emergency Department (HOSPITAL_COMMUNITY): Payer: Medicaid Other

## 2016-10-03 DIAGNOSIS — Z79899 Other long term (current) drug therapy: Secondary | ICD-10-CM | POA: Insufficient documentation

## 2016-10-03 DIAGNOSIS — F17210 Nicotine dependence, cigarettes, uncomplicated: Secondary | ICD-10-CM | POA: Insufficient documentation

## 2016-10-03 DIAGNOSIS — R079 Chest pain, unspecified: Secondary | ICD-10-CM | POA: Insufficient documentation

## 2016-10-03 LAB — BASIC METABOLIC PANEL
Anion gap: 13 (ref 5–15)
BUN: 7 mg/dL (ref 6–20)
CALCIUM: 9.5 mg/dL (ref 8.9–10.3)
CO2: 21 mmol/L — ABNORMAL LOW (ref 22–32)
Chloride: 106 mmol/L (ref 101–111)
Creatinine, Ser: 0.8 mg/dL (ref 0.44–1.00)
GFR calc Af Amer: >60 mL/min (ref >60)
GFR calc non Af Amer: >60 mL/min (ref >60)
GLUCOSE: 98 mg/dL (ref 65–99)
Potassium: 4.2 mmol/L (ref 3.5–5.1)
SODIUM: 140 mmol/L (ref 135–145)

## 2016-10-03 LAB — CBC
HCT: 42.8 % (ref 36.0–46.0)
Hemoglobin: 14.2 g/dL (ref 12.0–15.0)
MCH: 31.8 pg (ref 26.0–34.0)
MCHC: 33.2 g/dL (ref 30.0–36.0)
MCV: 96 fL (ref 78.0–100.0)
Platelets: 389 10*3/uL (ref 150–400)
RBC: 4.46 MIL/uL (ref 3.87–5.11)
RDW: 14.4 % (ref 11.5–15.5)
WBC: 14.3 10*3/uL — AB (ref 4.0–10.5)

## 2016-10-03 LAB — I-STAT TROPONIN, ED: TROPONIN I, POC: 0 ng/mL (ref 0.00–0.08)

## 2016-10-03 LAB — D-DIMER, QUANTITATIVE: D-Dimer, Quant: <0.27 ug/mL-FEU (ref 0.00–0.50)

## 2016-10-03 MED ORDER — GI COCKTAIL ~~LOC~~
30.0000 mL | Freq: Once | ORAL | Status: AC
  Administered 2016-10-03: 30 mL via ORAL
  Filled 2016-10-03: qty 30

## 2016-10-03 MED ORDER — ACETAMINOPHEN 500 MG PO TABS
1000.0000 mg | ORAL_TABLET | Freq: Once | ORAL | Status: AC
  Administered 2016-10-03: 1000 mg via ORAL
  Filled 2016-10-03: qty 2

## 2016-10-03 NOTE — ED Notes (Signed)
Patient transported to X-ray 

## 2016-10-03 NOTE — ED Triage Notes (Signed)
Patient reports left upper chest pain/ pressure radiating to left axilla onset this evening with SOB and nausea and occasional dry cough .

## 2016-10-03 NOTE — ED Notes (Signed)
Pt departed in NAD. Left before discharge papers were prepared and did not provide signature either.

## 2016-10-03 NOTE — ED Provider Notes (Signed)
[facility]-EMERGENCY DEPT Provider Note   CSN: [id number] Arrival date & time: 10/03/16  0254  By signing my name below, I, Teofilo Pod, attest that this documentation has been prepared under the direction and in the presence of Melene Plan, DO . Electronically Signed: Teofilo Pod, ED Scribe. 10/03/2016. 3:38 AM.    History   Chief Complaint Chief Complaint  Patient presents with  . Chest Pain    The history is provided by the patient. No language interpreter was used.   HPI Comments:  Diane Reyes is a 41 y.o. female with PMHx of a hear murmur who presents to the Emergency Department complaining of constant left upper chest pain onset yesterday evening. Pt states that the pain radiates to her left axilla, and complains of associated SOB, nausea, and non-productive cough. Pt states that when she was coughing she felt a tightness in her throat. Pt is a smoker. Pt denies any family history of cardiac problems. Pt does not take any medications regularly. Pt denies blood in cough.   Past Medical History:  Diagnosis Date  . Anxiety     There are no active problems to display for this patient.   Past Surgical History:  Procedure Laterality Date  . ABDOMINAL HYSTERECTOMY    . CESAREAN SECTION    . CHOLECYSTECTOMY      OB History    No data available       Home Medications    Prior to Admission medications   Medication Sig Start Date End Date Taking? Authorizing Provider  ciprofloxacin (CIPRO) 250 MG tablet Take 1 tablet (250 mg total) by mouth every 12 (twelve) hours. Patient not taking: Reported on 10/03/2016 05/11/16   Geoffery Lyons, MD  phenazopyridine (PYRIDIUM) 200 MG tablet Take 1 tablet (200 mg total) by mouth 3 (three) times daily as needed for pain. Patient not taking: Reported on 10/03/2016 05/11/16   Geoffery Lyons, MD    Family History No family history on file.  Social History Social History  Substance Use Topics  . Smoking status: Current Every Day Smoker   Types: Cigarettes  . Smokeless tobacco: Never Used  . Alcohol use Yes     Comment: 3 times a week     Allergies   Patient has no known allergies.   Review of Systems Review of Systems  Constitutional: Negative for chills and fever.  HENT: Negative for congestion and rhinorrhea.   Eyes: Negative for redness and visual disturbance.  Respiratory: Negative for shortness of breath and wheezing.   Cardiovascular: Positive for chest pain. Negative for palpitations.  Gastrointestinal: Negative for nausea and vomiting.  Genitourinary: Negative for dysuria and urgency.  Musculoskeletal: Negative for arthralgias and myalgias.  Skin: Negative for pallor and wound.  Neurological: Negative for dizziness and headaches.  All other systems reviewed and are negative.    Physical Exam Updated Vital Signs BP 120/68   Pulse 87   Temp 98 F (36.7 C) (Oral)   Resp 18   LMP 07/07/2014   SpO2 100%   Physical Exam  Constitutional: She is oriented to person, place, and time. She appears well-developed and well-nourished. No distress.  Smells of tobacco smoke.   HENT:  Head: Normocephalic and atraumatic.  Eyes: EOM are normal. Pupils are equal, round, and reactive to light.  Neck: Normal range of motion. Neck supple.  Cardiovascular: Normal rate and regular rhythm.  Exam reveals no gallop and no friction rub.   No murmur heard. Pulmonary/Chest: Effort normal. She  has no wheezes. She has no rales.  Abdominal: Soft. She exhibits no distension. There is no tenderness.  Musculoskeletal: She exhibits no edema or tenderness.  Neurological: She is alert and oriented to person, place, and time.  Skin: Skin is warm and dry. She is not diaphoretic.  Psychiatric: She has a normal mood and affect. Her behavior is normal.  Nursing note and vitals reviewed.    ED Treatments / Results  DIAGNOSTIC STUDIES:  Oxygen Saturation is 100% on RA, normal by my interpretation.    COORDINATION OF  CARE:  3:38 AM Discussed treatment plan with pt at bedside and pt agreed to plan.   Labs (all labs ordered are listed, but only abnormal results are displayed) Labs Reviewed  BASIC METABOLIC PANEL - Abnormal; Notable for the following:       Result Value   CO2 21 (*)    All other components within normal limits  CBC - Abnormal; Notable for the following:    WBC 14.3 (*)    All other components within normal limits  D-DIMER, QUANTITATIVE (NOT AT [facility])  Rosezena Sensor, ED    EKG  EKG Interpretation  Date/Time:  Monday October 03 2016 02:57:23 EST Ventricular Rate:  101 PR Interval:  132 QRS Duration: 80 QT Interval:  346 QTC Calculation: 448 R Axis:   85 Text Interpretation:  Sinus tachycardia with occasional Premature ventricular complexes Right atrial enlargement Nonspecific ST abnormality Abnormal ECG Since last tracing rate faster Otherwise no significant change Confirmed by Adiel Mcnamara MD, Reuel Boom ([id number]) on 10/03/2016 3:19:30 AM       Radiology Dg Chest 2 View  Result Date: 10/03/2016 CLINICAL DATA:  LEFT upper chest pain, difficulty breathing beginning today. EXAM: CHEST  2 VIEW COMPARISON:  Chest radiograph July 31, 2014 FINDINGS: Cardiomediastinal silhouette is normal. No pleural effusions or focal consolidations. Trachea projects midline and there is no pneumothorax. Soft tissue planes and included osseous structures are non-suspicious. IMPRESSION: Normal chest. Electronically Signed   By: Awilda Metro M.D.   On: 10/03/2016 03:30    Procedures Procedures (including critical care time)  Medications Ordered in ED Medications  acetaminophen (TYLENOL) tablet 1,000 mg (1,000 mg Oral Given 10/03/16 0355)  gi cocktail (Maalox,Lidocaine,Donnatal) (30 mLs Oral Given 10/03/16 0357)     Initial Impression / Assessment and Plan / ED Course  I have reviewed the triage vital signs and the nursing notes.  Pertinent labs & imaging results that were available during my care of  the patient were reviewed by me and considered in my medical decision making (see chart for details).  Clinical Course     41 yo F With a chief complaint of chest pain. This is worse when she lies back flat and better when she sits up. Occurred in the middle the night. She felt that lasted for about an hour. Nonexertional. Unable to [facility], due to tachycardia. D-dimer negative. Discharge home with trial of Zantac.  6:14 AM:  I have discussed the diagnosis/risks/treatment options with the patient and family and believe the pt to be eligible for discharge home to follow-up with PCP. We also discussed returning to the ED immediately if new or worsening sx occur. We discussed the sx which are most concerning (e.g., sudden worsening pain, fever, inability to tolerate by mouth) that necessitate immediate return. Medications administered to the patient during their visit and any new prescriptions provided to the patient are listed below.  Medications given during this visit Medications  acetaminophen (TYLENOL) tablet 1,000  mg (1,000 mg Oral Given 10/03/16 0355)  gi cocktail (Maalox,Lidocaine,Donnatal) (30 mLs Oral Given 10/03/16 0357)     The patient appears reasonably screen and/or stabilized for discharge and I doubt any other medical condition or other [facility] requiring further screening, evaluation, or treatment in the ED at this time prior to discharge.    Final Clinical Impressions(s) / ED Diagnoses   Final diagnoses:  Nonspecific chest pain    New Prescriptions Discharge Medication List as of 10/03/2016  5:01 AM     I personally performed the services described in this documentation, which was scribed in my presence. The recorded information has been reviewed and is accurate.     Melene Planan Deseri Loss, DO 10/03/16 [phone]

## 2016-10-03 NOTE — Discharge Instructions (Signed)
Try zantac 150mg twice a day.  ° ° °

## 2016-11-17 ENCOUNTER — Ambulatory Visit
Admission: RE | Admit: 2016-11-17 | Discharge: 2016-11-17 | Disposition: A | Discharge status: Home / Self Care | Payer: Medicaid Other | Source: Ambulatory Visit | Attending: Internal Medicine | Admitting: Internal Medicine

## 2016-11-17 DIAGNOSIS — N6322 Unspecified lump in the left breast, upper inner quadrant: Secondary | ICD-10-CM

## 2019-01-12 ENCOUNTER — Other Ambulatory Visit: Payer: Self-pay

## 2019-01-12 ENCOUNTER — Encounter (HOSPITAL_COMMUNITY): Payer: Self-pay | Admitting: Emergency Medicine

## 2019-01-12 ENCOUNTER — Emergency Department (HOSPITAL_COMMUNITY)
Admission: EM | Admit: 2019-01-12 | Discharge: 2019-01-12 | Disposition: A | Discharge status: Home / Self Care | Payer: Medicaid - Out of State | Attending: Emergency Medicine | Admitting: Emergency Medicine

## 2019-01-12 ENCOUNTER — Emergency Department (HOSPITAL_COMMUNITY): Payer: Medicaid - Out of State

## 2019-01-12 DIAGNOSIS — Y929 Unspecified place or not applicable: Secondary | ICD-10-CM | POA: Diagnosis not present

## 2019-01-12 DIAGNOSIS — Z23 Encounter for immunization: Secondary | ICD-10-CM | POA: Insufficient documentation

## 2019-01-12 DIAGNOSIS — Y9389 Activity, other specified: Secondary | ICD-10-CM | POA: Diagnosis not present

## 2019-01-12 DIAGNOSIS — S51851A Open bite of right forearm, initial encounter: Secondary | ICD-10-CM | POA: Diagnosis present

## 2019-01-12 DIAGNOSIS — F17210 Nicotine dependence, cigarettes, uncomplicated: Secondary | ICD-10-CM | POA: Insufficient documentation

## 2019-01-12 DIAGNOSIS — Y999 Unspecified external cause status: Secondary | ICD-10-CM | POA: Insufficient documentation

## 2019-01-12 DIAGNOSIS — W540XXA Bitten by dog, initial encounter: Secondary | ICD-10-CM | POA: Diagnosis not present

## 2019-01-12 DIAGNOSIS — T148XXA Other injury of unspecified body region, initial encounter: Secondary | ICD-10-CM

## 2019-01-12 MED ORDER — BACITRACIN ZINC 500 UNIT/GM EX OINT
TOPICAL_OINTMENT | Freq: Once | CUTANEOUS | Status: AC
Start: 2019-01-12 — End: 2019-01-12
  Administered 2019-01-12: 3 via TOPICAL
  Filled 2019-01-12: qty 2.7

## 2019-01-12 MED ORDER — AMOXICILLIN-POT CLAVULANATE 875-125 MG PO TABS: 1.0000 | ORAL_TABLET | Freq: Two times a day (BID) | ORAL | 0 refills | Status: AC

## 2019-01-12 MED ORDER — TETANUS-DIPHTH-ACELL PERTUSSIS 5-2.5-18.5 LF-MCG/0.5 IM SUSP
0.5000 mL | Freq: Once | INTRAMUSCULAR | Status: AC
  Administered 2019-01-12: 21:00:00 0.5 mL via INTRAMUSCULAR
  Filled 2019-01-12: qty 0.5

## 2019-01-12 MED ORDER — NAPROXEN 500 MG PO TABS
500.0000 mg | ORAL_TABLET | Freq: Once | ORAL | Status: AC
  Administered 2019-01-12: 500 mg via ORAL
  Filled 2019-01-12: qty 1

## 2019-01-12 MED ORDER — NAPROXEN 500 MG PO TABS: 500.0000 mg | ORAL_TABLET | Freq: Two times a day (BID) | ORAL | 0 refills | Status: AC

## 2019-01-12 MED ORDER — AMOXICILLIN-POT CLAVULANATE 875-125 MG PO TABS
1.0000 | ORAL_TABLET | Freq: Once | ORAL | Status: AC
  Administered 2019-01-12: 1 via ORAL
  Filled 2019-01-12: qty 1

## 2019-01-12 NOTE — ED Notes (Signed)
Patient arm was cleaned with normal saline and bacitricin applied. Patient arm was wrapped.

## 2019-01-12 NOTE — ED Triage Notes (Signed)
Pt dog was getting attacked by her friend's dog and was trying to separate them when she got bit on her right arm in a couple places. Dog shots are up to date per owner.

## 2019-01-12 NOTE — ED Provider Notes (Signed)
[facility]-EMERGENCY DEPT Provider Note   CSN: [id number] Arrival date & time: 01/12/19  1846    History   Chief Complaint Chief Complaint  Patient presents with  . Animal Bite    HPI Diane Reyes is a 43 y.o. female.     Patient presents the emergency department after a dog bite to the right forearm sustained just prior to arrival.  Patient was trying to break up a fight between 2 dogs when she was bitten on the arm.  Dogs are up-to-date on rabies.  No treatments prior to arrival.  Patient complains of pain in her forearm.  No distal numbness or tingling.  She is able to move her hand and wrist and elbow without any issues.  Last tetanus approximately 10 years ago.  No allergies.     Past Medical History:  Diagnosis Date  . Anxiety     There are no active problems to display for this patient.   Past Surgical History:  Procedure Laterality Date  . ABDOMINAL HYSTERECTOMY    . CESAREAN SECTION    . CHOLECYSTECTOMY       OB History   No obstetric history on file.      Home Medications    Prior to Admission medications   Medication Sig Start Date End Date Taking? Authorizing Provider  ciprofloxacin (CIPRO) 250 MG tablet Take 1 tablet (250 mg total) by mouth every 12 (twelve) hours. Patient not taking: Reported on 10/03/2016 05/11/16   Geoffery Lyonselo, Douglas, MD  phenazopyridine (PYRIDIUM) 200 MG tablet Take 1 tablet (200 mg total) by mouth 3 (three) times daily as needed for pain. Patient not taking: Reported on 10/03/2016 05/11/16   Geoffery Lyonselo, Douglas, MD    Family History No family history on file.  Social History Social History   Tobacco Use  . Smoking status: Current Every Day Smoker    Types: Cigarettes  . Smokeless tobacco: Never Used  Substance Use Topics  . Alcohol use: Yes    Comment: 3 times a week  . Drug use: No     Allergies   Patient has no known allergies.   Review of Systems Review of Systems  Constitutional: Negative for  activity change.  Musculoskeletal: Positive for myalgias. Negative for arthralgias, back pain, joint swelling and neck pain.  Skin: Positive for wound.  Neurological: Negative for weakness and numbness.     Physical Exam Updated Vital Signs BP 119/70 (BP Location: Left Arm)   Pulse (!) 116   Temp 98.1 F (36.7 C) (Oral)   Resp 18   LMP 07/07/2014   SpO2 98%   Physical Exam Vitals signs and nursing note reviewed.  Constitutional:      Appearance: She is well-developed.  HENT:     Head: Normocephalic and atraumatic.  Eyes:     Conjunctiva/sclera: Conjunctivae normal.  Neck:     Musculoskeletal: Normal range of motion and neck supple.  Pulmonary:     Effort: No respiratory distress.  Musculoskeletal:     Right elbow: Normal.She exhibits normal range of motion and no swelling. No tenderness found.     Right wrist: She exhibits normal range of motion, no tenderness and no bony tenderness.     Right forearm: She exhibits laceration.       Arms:  Skin:    General: Skin is warm and dry.  Neurological:     Mental Status: She is alert.      ED Treatments / Results  Labs (all  labs ordered are listed, but only abnormal results are displayed) Labs Reviewed - No data to display  EKG None  Radiology No results found.  Procedures Procedures (including critical care time)  Medications Ordered in ED Medications  naproxen (NAPROSYN) tablet 500 mg (has no administration in time range)  amoxicillin-clavulanate (AUGMENTIN) 875-125 MG per tablet 1 tablet (1 tablet Oral Given 01/12/19 2056)  Tdap (BOOSTRIX) injection 0.5 mL (0.5 mLs Intramuscular Given 01/12/19 2055)  bacitracin ointment (3 application Topical Given 01/12/19 2057)     Initial Impression / Assessment and Plan / ED Course  I have reviewed the triage vital signs and the nursing notes.  Pertinent labs & imaging results that were available during my care of the patient were reviewed by me and considered in my  medical decision making (see chart for details).        Patient seen and examined.  Wound examined.  Will obtain x-ray to rule out foreign body and bony injury.  Wounds will be cleaned and dressed.  Discussed indication for closure at this point with patient.  Will start on Augmentin and update tetanus.  Vital signs reviewed and are as follows: BP 119/70 (BP Location: Left Arm)   Pulse (!) 116   Temp 98.1 F (36.7 C) (Oral)   Resp 18   LMP 07/07/2014   SpO2 98%   9:15 PM wound was cleaned and dressed by nurse.  Patient updated on x-ray results.  We will discharge home on Augmentin and naproxen.  Pt urged to return with worsening pain, worsening swelling, expanding area of redness or streaking up extremity, fever, or any other concerns. Urged to take complete course of antibiotics as prescribed. Counseled to take pain medications as prescribed. Pt verbalizes understanding and agrees with plan.   Final Clinical Impressions(s) / ED Diagnoses   Final diagnoses:  Animal bite   Patient with bite wounds to the arm from a known animal.  X-ray negative for foreign body.  Tetanus updated.  Patient started on prophylactic antibiotics.  No functional deficits noted on exam.  Distal circulation, motor, sensation intact.  ED Discharge Orders         Ordered    amoxicillin-clavulanate (AUGMENTIN) 875-125 MG tablet  Every 12 hours     01/12/19 2110    naproxen (NAPROSYN) 500 MG tablet  2 times daily     01/12/19 2110           Renne Crigler, Cordelia Poche 01/12/19 2116    Benjiman Core, MD 01/12/19 [phone]

## 2019-01-12 NOTE — Discharge Instructions (Signed)
Please read and follow all provided instructions.  Your diagnoses today include:  1. Animal bite     Tests performed today include:  Vital signs. See below for your results today.   X-ray - no broken bones, shows soft tissue swelling  Medications prescribed:   Augmentin - antibiotic  You have been prescribed an antibiotic medicine: take the entire course of medicine even if you are feeling better. Stopping early can cause the antibiotic not to work.   Naproxen - anti-inflammatory pain medication  Do not exceed 500mg  naproxen every 12 hours, take with food  You have been prescribed an anti-inflammatory medication or NSAID. Take with food. Take smallest effective dose for the shortest duration needed for your pain. Stop taking if you experience stomach pain or vomiting.   Take any prescribed medications only as directed.   Home care instructions:  Follow any educational materials contained in this packet. Keep affected area above the level of your heart when possible. Wash area gently twice a day with warm soapy water. Do not apply alcohol or hydrogen peroxide. Cover the area if it draining or weeping.   Follow-up instructions: Please follow-up with your primary care provider in the next 1 week for further evaluation of your symptoms.   Return instructions:  Return to the Emergency Department if you have:  Fever  Worsening symptoms  Worsening pain  Worsening swelling  Redness of the skin that moves away from the affected area, especially if it streaks away from the affected area   Any other emergent concerns  Your vital signs today were: BP 119/70 (BP Location: Left Arm)    Pulse (!) 116    Temp 98.1 F (36.7 C) (Oral)    Resp 18    Ht 5\' 3"  (1.6 m)    Wt 68 kg    LMP 07/07/2014    SpO2 98%    BMI 26.57 kg/m  If your blood pressure (BP) was elevated above 135/85 this visit, please have this repeated by your doctor within one month. --------------

## 2019-01-12 NOTE — ED Notes (Signed)
Patient is has 4 punctures marks on the anterior forearm on the right side, and has another puncture mark near the elbow that is near here body. Patient is requesting pain medication. Patient has not other complaints except for the bite marks.

## 2019-07-06 IMAGING — CR RIGHT FOREARM - 2 VIEW
2 series · 2 of 2 positions shown · non-contrast
Comparison: None.

CLINICAL DATA: Injury from dog bite. Rule out foreign body.

EXAM:
RIGHT FOREARM - 2 VIEW

[x forearm ap right]
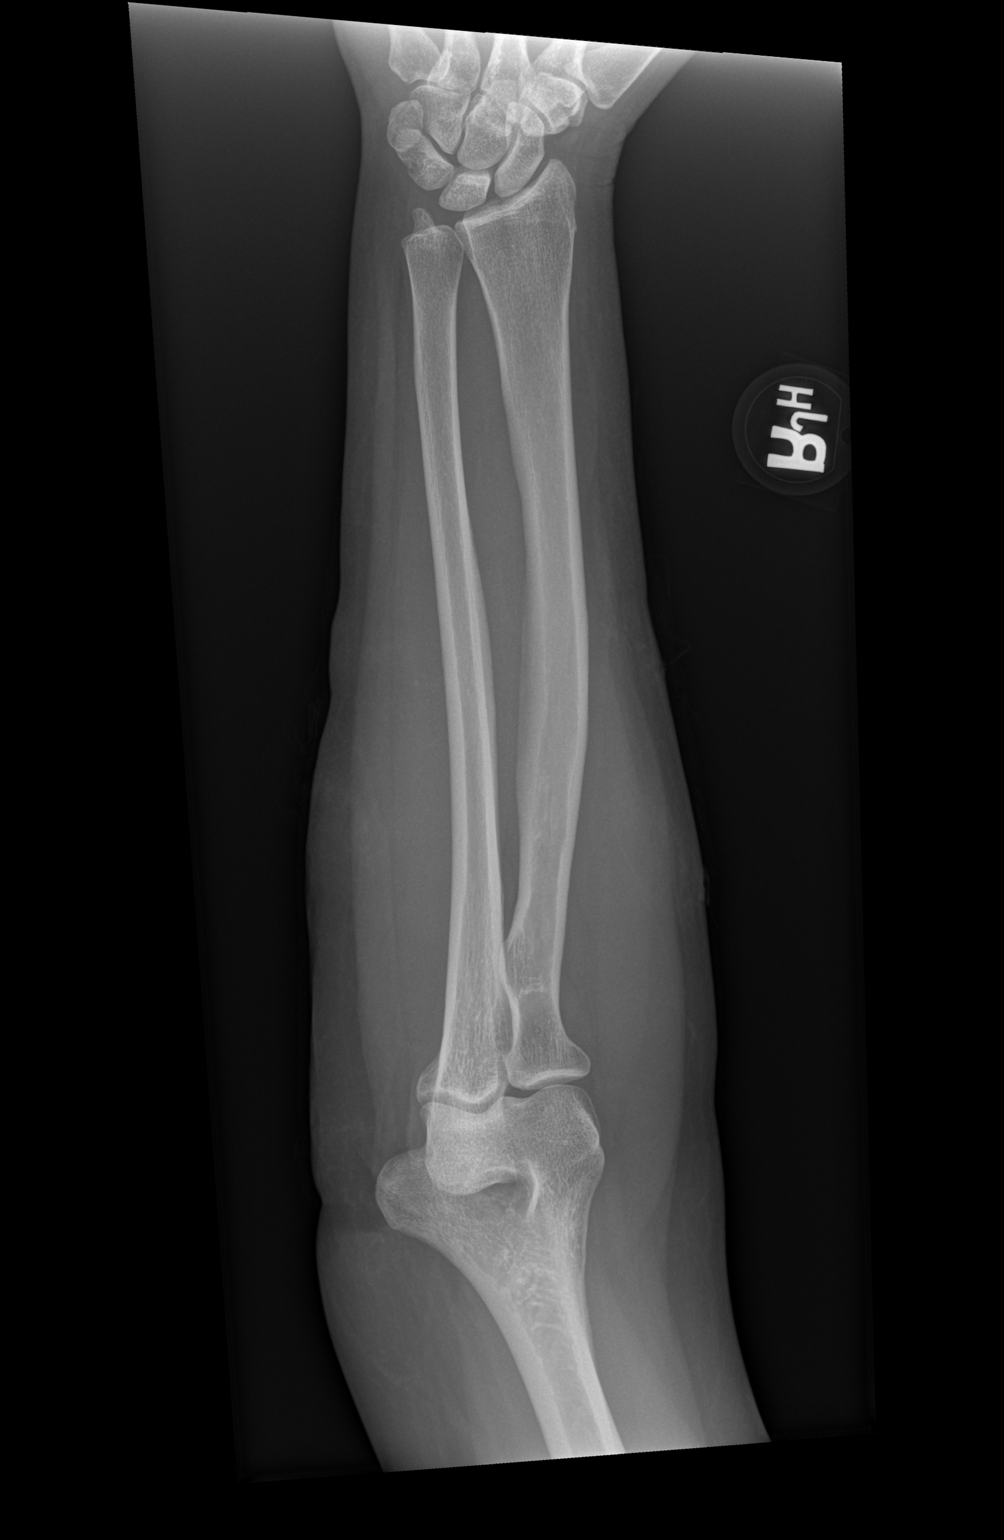

[x forearm lat right]
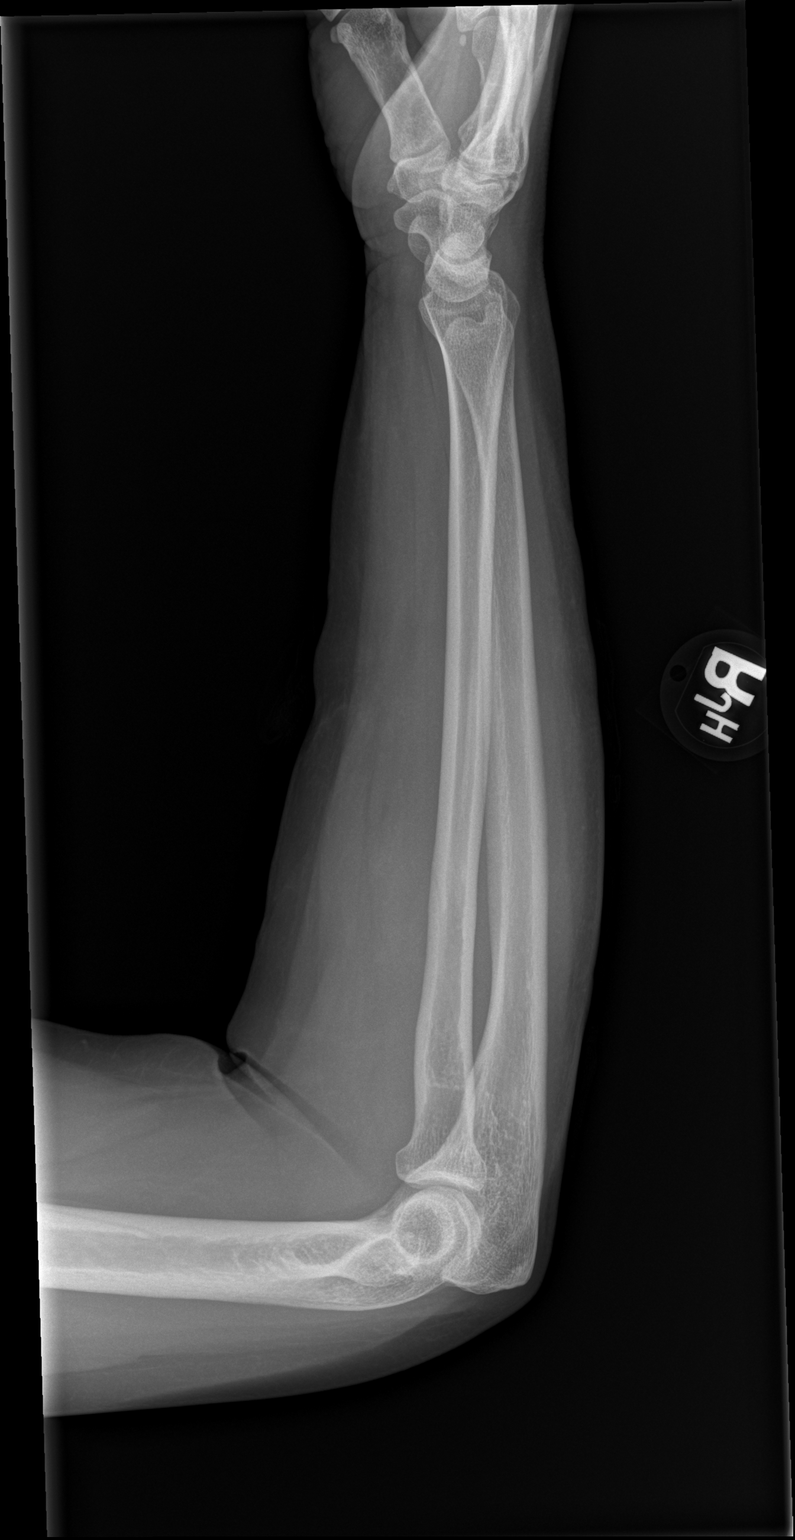

[2 of 2 positions shown; findings below may reference images not displayed]

FINDINGS: Cortical margins of the radius and ulna are intact. There is no
evidence of fracture or other focal bone lesions. Mild soft tissue
edema without tracking soft tissue air. No visualized radiopaque
foreign body.
IMPRESSION: Soft tissue edema without visualized radiopaque foreign body. No
osseous abnormality.

## 2022-06-05 ENCOUNTER — Emergency Department
Admission: EM | Admit: 2022-06-05 | Discharge: 2022-06-05 | Disposition: A | Discharge status: Home / Self Care | Payer: Medicaid Other | Attending: Emergency Medicine | Admitting: Emergency Medicine

## 2022-06-05 ENCOUNTER — Other Ambulatory Visit: Payer: Self-pay

## 2022-06-05 ENCOUNTER — Emergency Department (HOSPITAL_BASED_OUTPATIENT_CLINIC_OR_DEPARTMENT_OTHER): Payer: Medicaid Other

## 2022-06-05 ENCOUNTER — Encounter (HOSPITAL_BASED_OUTPATIENT_CLINIC_OR_DEPARTMENT_OTHER): Payer: Self-pay

## 2022-06-05 DIAGNOSIS — Z20822 Contact with and (suspected) exposure to covid-19: Secondary | ICD-10-CM | POA: Insufficient documentation

## 2022-06-05 DIAGNOSIS — F419 Anxiety disorder, unspecified: Secondary | ICD-10-CM | POA: Insufficient documentation

## 2022-06-05 DIAGNOSIS — F17210 Nicotine dependence, cigarettes, uncomplicated: Secondary | ICD-10-CM | POA: Insufficient documentation

## 2022-06-05 DIAGNOSIS — B349 Viral infection, unspecified: Secondary | ICD-10-CM | POA: Insufficient documentation

## 2022-06-05 DIAGNOSIS — R52 Pain, unspecified: Secondary | ICD-10-CM | POA: Insufficient documentation

## 2022-06-05 LAB — TROPONIN-I: TROPONIN I: <2 ng/L (ref <15)

## 2022-06-05 LAB — COMPREHENSIVE METABOLIC PANEL, NON-FASTING
ALBUMIN/GLOBULIN RATIO: 1.2 (ref 0.8–1.4)
ALBUMIN: 3.8 g/dL (ref 3.4–5.0)
ALKALINE PHOSPHATASE: 48 U/L (ref 46–116)
ALT (SGPT): 8 U/L (ref <=78)
ANION GAP: 9 mmol/L (ref 4–13)
AST (SGOT): 13 U/L — ABNORMAL LOW (ref 15–37)
BILIRUBIN TOTAL: 0.7 mg/dL (ref 0.2–1.0)
BUN/CREA RATIO: 11
BUN: 8 mg/dL (ref 7–18)
CALCIUM, CORRECTED: 9.1 mg/dL
CALCIUM: 8.9 mg/dL (ref 8.5–10.1)
CHLORIDE: 108 mmol/L — ABNORMAL HIGH (ref 98–107)
CO2 TOTAL: 25 mmol/L (ref 21–32)
CREATININE: 0.75 mg/dL (ref 0.55–1.02)
ESTIMATED GFR: 101 mL/min/{1.73_m2} (ref >59)
GLOBULIN: 3.3
GLUCOSE: 115 mg/dL — ABNORMAL HIGH (ref 74–106)
OSMOLALITY, CALCULATED: 282 mOsm/kg (ref 270–290)
POTASSIUM: 3.8 mmol/L (ref 3.5–5.1)
PROTEIN TOTAL: 7.1 g/dL (ref 6.4–8.2)
SODIUM: 142 mmol/L (ref 136–145)

## 2022-06-05 LAB — CBC WITH DIFF
BASOPHIL #: 0.03 10*3/uL (ref 0.00–0.30)
BASOPHIL %: 0 % (ref 0–3)
EOSINOPHIL #: 0.39 10*3/uL (ref 0.00–0.80)
EOSINOPHIL %: 4 % (ref 0–7)
HCT: 39.4 % (ref 37.0–47.0)
HGB: 13.5 g/dL (ref 12.5–16.0)
LYMPHOCYTE #: 2.05 10*3/uL (ref 1.10–5.00)
LYMPHOCYTE %: 23 % — ABNORMAL LOW (ref 25–45)
MCH: 32 pg (ref 27.0–32.0)
MCHC: 34.2 g/dL (ref 32.0–36.0)
MCV: 93.7 fL (ref 78.0–99.0)
MONOCYTE #: 0.62 10*3/uL (ref 0.00–1.30)
MONOCYTE %: 7 % (ref 0–12)
MPV: 7.6 fL (ref 7.4–10.4)
NEUTROPHIL #: 5.97 10*3/uL (ref 1.80–8.40)
NEUTROPHIL %: 66 % (ref 40–76)
PLATELETS: 326 10*3/uL (ref 140–440)
RBC: 4.2 10*6/uL (ref 4.20–5.40)
RDW: 15.1 % — ABNORMAL HIGH (ref 11.6–14.8)
WBC: 9.1 10*3/uL (ref 4.0–10.5)

## 2022-06-05 LAB — URINALYSIS, MACRO/MICRO
BILIRUBIN: NEGATIVE mg/dL
GLUCOSE: NEGATIVE mg/dL
KETONES: NEGATIVE mg/dL
LEUKOCYTES: NEGATIVE WBCs/uL
NITRITE: NEGATIVE
PH: 6 (ref 4.6–8.0)
PROTEIN: NEGATIVE mg/dL
SPECIFIC GRAVITY: 1.02 (ref 1.003–1.035)
UROBILINOGEN: 0.2 mg/dL (ref 0.2–1.0)

## 2022-06-05 LAB — COVID-19, FLU A/B, RSV RAPID BY PCR
INFLUENZA VIRUS TYPE A: NOT DETECTED
INFLUENZA VIRUS TYPE B: NOT DETECTED
RESPIRATORY SYNCTIAL VIRUS (RSV): NOT DETECTED
SARS-CoV-2: NOT DETECTED

## 2022-06-05 LAB — URINALYSIS, MICROSCOPIC

## 2022-06-05 LAB — LIPASE: LIPASE: 71 U/L — ABNORMAL LOW (ref 73–393)

## 2022-06-05 LAB — LACTIC ACID LEVEL W/ REFLEX FOR LEVEL >2.0: LACTIC ACID: 0.7 mmol/L (ref 0.4–2.0)

## 2022-06-05 MED ORDER — HYDROXYZINE PAMOATE 25 MG CAPSULE
25.0000 mg | ORAL_CAPSULE | ORAL | Status: AC
Start: 2022-06-05 — End: 2022-06-05
  Administered 2022-06-05: 25 mg via ORAL

## 2022-06-05 MED ORDER — HYDROXYZINE PAMOATE 25 MG CAPSULE
ORAL_CAPSULE | ORAL | Status: AC
Start: 2022-06-05 — End: 2022-06-05
  Filled 2022-06-05: qty 1
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
## 2022-06-05 NOTE — ED Provider Notes (Signed)
[facility], [facility] Emergency Department  ED Primary Provider Note  HPI:  Whitney Salazar is a 46 y.o. female     Patient complains of body aches nausea for 2 days' duration.  Intermittently short of breath.  Occasional chest tightness.  She was seen by primary care 3 days ago started on amoxicillin Flonase ear drops and Zyrtec for an ear and upper respiratory tract infection.  Patient denies any urinary symptoms rash.  Denies any significant past medical history.  Smoker.  Full code.    ROS review and negative aside from stated in HPI.    Physical Exam:  ED Triage Vitals [06/05/22 0035]   BP (Non-Invasive) 132/80   Heart Rate 84   Respiratory Rate 18   Temperature 36.2 C (97.2 F)   SpO2 99 %   Weight 54.4 kg (120 lb)   Height 1.575 m (5' 2" )     No acute distress.  Somewhat disheveled. Patient awake alert oriented x3.  Mood is appropriate.  Pupils 3 mm equal round reactive.  Extraocular movements are intact.  Oropharynx is clear.  Mucous membranes moist.  Trachea midline.  Neck is supple.  Heart has regular rate and rhythm without significant murmurs rubs or gallops.  Lungs are clear to auscultation.  Abdomen soft nontender, nondistended.  Moving all extremities without difficulty.  No rash no edema.     Patient data:  Labs Ordered/Reviewed   LIPASE - Abnormal; Notable for the following components:       Result Value    LIPASE 71 (*)     All other components within normal limits   COMPREHENSIVE METABOLIC PANEL, NON-FASTING - Abnormal; Notable for the following components:    CHLORIDE 108 (*)     GLUCOSE 115 (*)     AST (SGOT) 13 (*)     All other components within normal limits    Narrative:     Estimated Glomerular Filtration Rate (eGFR) is calculated using the CKD-EPI (2021) equation, intended for patients 26 years of age and older. If gender is not documented or "unknown", there will be no eGFR calculation.   CBC WITH DIFF - Abnormal; Notable for the following components:    RDW  15.1 (*)     LYMPHOCYTE % 23 (*)     All other components within normal limits   URINALYSIS, MACRO/MICRO - Abnormal; Notable for the following components:    COLOR Light Yellow (*)     BLOOD Trace (*)     All other components within normal limits   URINALYSIS, MICROSCOPIC - Abnormal; Notable for the following components:    BACTERIA Rare (*)     All other components within normal limits   COVID-19, FLU A/B, RSV RAPID BY PCR - Normal    Narrative:     Results are for the simultaneous qualitative identification of SARS-CoV-2 (formerly 2019-nCoV), Influenza A, Influenza B, and RSV RNA. These etiologic agents are generally detectable in nasopharyngeal and nasal swabs during the ACUTE PHASE of infection. Hence, this test is intended to be performed on respiratory specimens collected from individuals with signs and symptoms of upper respiratory tract infection who meet Centers for Disease Control and Prevention (CDC) clinical and/or epidemiological criteria for Coronavirus Disease 2019 (COVID-19) testing. CDC COVID-19 criteria for testing on human specimens is available at [facility] webpage information for Healthcare Professionals: Coronavirus Disease 2019 (COVID-19) ([email or web address]).     False-negative results may occur if the virus has genomic mutations, insertions, deletions, or rearrangements  or if performed very early in the course of illness. Otherwise, negative results indicate virus specific RNA targets are not detected, however negative results do not preclude SARS-CoV-2 infection/COVID-19, Influenza, or Respiratory syncytial virus infection. Results should not be used as the sole basis for patient management decisions. Negative results must be combined with clinical observations, patient history, and epidemiological information. If upper respiratory tract infection is still suspected based on exposure history together with other clinical findings, re-testing should be  considered.    Disclaimer:   This assay has been authorized by FDA under an Emergency Use Authorization for use in laboratories certified under the Clinical Laboratory Improvement Amendments of 1988 (CLIA), 42 U.S.C. [phone], to perform high complexity tests. The impacts of vaccines, antiviral therapeutics, antibiotics, chemotherapeutic or immunosuppressant drugs have not been evaluated.     Test methodology:   Cepheid Xpert Xpress SARS-CoV-2/Flu/RSV Assay real-time polymerase chain reaction (RT-PCR) test on the GeneXpert Dx and Xpert Xpress systems.   LACTIC ACID LEVEL W/ REFLEX FOR LEVEL >2.0 - Normal   TROPONIN-I - Normal    Narrative:     Values received on females ranging between 12-15 ng/L MUST include the next serial troponin to review changes in the delta differences as the reference range for the Access II chemistry analyzer is lower than the established reference range.     CBC/DIFF    Narrative:     The following orders were created for panel order CBC/DIFF.  Procedure                               Abnormality         Status                     ---------                               -----------         ------                     CBC WITH PQZR[[id number]]                Abnormal            Final result                 Please view results for these tests on the individual orders.   URINALYSIS WITH REFLEX MICROSCOPIC AND CULTURE IF POSITIVE    Narrative:     The following orders were created for panel order URINALYSIS WITH REFLEX MICROSCOPIC AND CULTURE IF POSITIVE.  Procedure                               Abnormality         Status                     ---------                               -----------         ------                     URINALYSIS, MACRO/MICRO[[id number]]      Abnormal  Final result               URINALYSIS, MICROSCOPIC[[id number]]      Abnormal            Final result                 Please view results for these tests on the individual orders.     XR AP MOBILE CHEST   Final Result  by Edi, Radresults In (09/03 0143)   NO ACUTE FINDINGS.         Radiologist location ID: [id number]             MDM:  Body aches nausea and URI symptoms.  Likely viral illness.  Will evaluate for any evidence of pneumonia.  Will screen for COVID.  Anxiety is likely a strong contributor.  Vitals are within normal limits.  Screening labs including lactate troponin CBC CMP are grossly within normal.  UA nonspecific.  COVID influenza negative.  Chest x-ray unremarkable.  I did review these findings with the patient and answered her questions.  She was given hydroxyzine for anxiety reported of symptoms.  She denies SI HI.  I asked her to follow closely with primary care and I gave strict return to ED instructions in both a verbal written manner  ED Course as of 06/05/22 0832   Sun Jun 05, 2022   0831 [facility]: 32.0  To ED instructions both verbal and written manner.  Patient comfortable with discharge      Discharged  Clinical Impression   Anxiety (Primary)   Body aches   Viral illness     Medications Administered in the ED   hydrOXYzine pamoate (VISTARIL) capsule (25 mg Oral Given 06/05/22 0220)        Current Discharge Medication List        CONTINUE these medications - NO CHANGES were made during your visit.        Details   amoxicillin-pot clavulanate 875-125 mg Tablet  Commonly known as: AUGMENTIN   1 Tablet, Oral, 2 TIMES DAILY  Refills: 0     clonazePAM 1 mg Tablet  Commonly known as: KLONOPIN   1 mg, Oral, 3 TIMES DAILY  Refills: 0     cyclobenzaprine 10 mg Tablet  Commonly known as: FLEXERIL   10 mg, Oral, 3 TIMES DAILY  Refills: 0     fluticasone propionate 50 mcg/actuation Spray, Suspension  Commonly known as: FLONASE   1 Spray, Each Nostril, DAILY  Refills: 0     gabapentin 400 mg Capsule  Commonly known as: NEURONTIN   800 mg, Oral, 2 TIMES DAILY  Refills: 0

## 2022-06-05 NOTE — Discharge Instructions (Signed)
I your seen emergency department for a variety of complaints.  Your evaluation including EKG x-ray lab work did not reveal critical condition requiring hospitalization.  Please continue medications and follow up primary care ideally within the next 3-5 days.  Return to emergency department sooner if he have new worsening shortness of breath or any other concerning symptoms.  Thank you for visiting [facility].

## 2022-06-05 NOTE — ED Triage Notes (Signed)
Pt states that for the last two days she has had body aches and tightness in her chest. Pt states that she went to family doctor and was told she had an ear infection and upper respiratory infection. Pt was put on amoxicillin, Flonase, ear drops, and Zyrtec.States that she has been gassy and bloated. Pt states that she took Tylenol at 1600 yesterday.

## 2022-06-05 NOTE — ED Nurses Note (Signed)
Patient verbalized understanding of discharge instructions.  ?

## 2022-10-03 DEATH — deceased
